# Patient Record
Sex: Female | Born: 1955 | Race: Black or African American | Hispanic: No | Marital: Single | Smoking: Never smoker
Health system: Southern US, Community
[De-identification: ages and names within clinical notes are randomized; demographics above are authoritative.]

---

## 2002-05-06 ENCOUNTER — Encounter: Payer: Self-pay | Admitting: Emergency Medicine

## 2002-05-06 ENCOUNTER — Emergency Department (HOSPITAL_COMMUNITY): Admission: EM | Admit: 2002-05-06 | Discharge: 2002-05-06 | Payer: Self-pay | Admitting: Emergency Medicine

## 2009-08-10 ENCOUNTER — Emergency Department (HOSPITAL_COMMUNITY): Admission: EM | Admit: 2009-08-10 | Discharge: 2009-08-10 | Payer: Self-pay | Admitting: Emergency Medicine

## 2012-11-02 ENCOUNTER — Emergency Department (INDEPENDENT_AMBULATORY_CARE_PROVIDER_SITE_OTHER)
Admission: EM | Admit: 2012-11-02 | Discharge: 2012-11-02 | Disposition: A | Discharge status: Home / Self Care | Payer: BC Managed Care – PPO | Source: Home / Self Care

## 2012-11-02 ENCOUNTER — Encounter (HOSPITAL_COMMUNITY): Payer: Self-pay | Admitting: Emergency Medicine

## 2012-11-02 DIAGNOSIS — J040 Acute laryngitis: Secondary | ICD-10-CM

## 2012-11-02 DIAGNOSIS — J069 Acute upper respiratory infection, unspecified: Secondary | ICD-10-CM

## 2012-11-02 MED ORDER — LIDOCAINE VISCOUS 2 % MT SOLN: 20.0000 mL | OROMUCOSAL | Status: DC | PRN

## 2012-11-02 MED ORDER — PREDNISONE 10 MG PO TABS: 50.0000 mg | ORAL_TABLET | Freq: Every day | ORAL | Status: DC

## 2012-11-02 NOTE — ED Provider Notes (Signed)
Medical screening examination/treatment/procedure(s) were performed by non-physician practitioner and as supervising physician I was immediately available for consultation/collaboration.  Murlene Revell, M.D.  Kelsee Preslar C Yutaka Holberg, MD 11/02/12 2116 

## 2012-11-02 NOTE — ED Notes (Signed)
Pt is here for sore throat x3 days Sx include: dysphagia Denies: f/v/n/d Recently got over the cold Has been taking OTC cold meds w/little relief  She is alert and oriented w/no signs of acute distress.

## 2012-11-02 NOTE — ED Provider Notes (Signed)
History     CSN: [id number]  Arrival date & time 11/02/12  1201   None     Chief Complaint  Patient presents with  . Sore Throat    (Consider location/radiation/quality/duration/timing/severity/associated sxs/prior treatment) Patient is a 57 y.o. female presenting with pharyngitis. The history is provided by the patient.  Sore Throat This is a new problem. Episode onset: 5 days ago. The problem occurs constantly. The problem has been gradually worsening. Associated symptoms comments: Congestion, post nasal drip. The symptoms are aggravated by swallowing. Nothing relieves the symptoms. Treatments tried: otc cold medicines. Improvement on treatment: transient relief.    History reviewed. No pertinent past medical history.  History reviewed. No pertinent past surgical history.  History reviewed. No pertinent family history.  History  Substance Use Topics  . Smoking status: Never Smoker   . Smokeless tobacco: Not on file  . Alcohol Use: No    OB History   Grav Para Term Preterm Abortions TAB SAB Ect Mult Living                  Review of Systems  Constitutional: Positive for chills. Negative for fever.  HENT: Positive for congestion, sore throat, rhinorrhea, voice change and postnasal drip. Negative for ear pain, trouble swallowing and sinus pressure.   Respiratory: Positive for cough.     Allergies  Review of patient's allergies indicates no known allergies.  Home Medications   Current Outpatient Rx  Name  Route  Sig  Dispense  Refill  . lidocaine (XYLOCAINE) 2 % solution   Oral   Take 20 mLs by mouth every 3 (three) hours as needed for pain.   100 mL   0   . predniSONE (DELTASONE) 10 MG tablet   Oral   Take 5 tablets (50 mg total) by mouth daily.   15 tablet   0     BP 135/72  Pulse 84  Temp(Src) 98.6 F (37 C) (Oral)  Resp 16  SpO2 96%  Physical Exam  Constitutional: She appears well-developed and well-nourished. She appears ill. No distress.   HENT:  Right Ear: Tympanic membrane, external ear and ear canal normal.  Left Ear: Tympanic membrane, external ear and ear canal normal.  Nose: Mucosal edema and rhinorrhea present. Right sinus exhibits no maxillary sinus tenderness and no frontal sinus tenderness. Left sinus exhibits no maxillary sinus tenderness and no frontal sinus tenderness.  Mouth/Throat: Mucous membranes are normal. Posterior oropharyngeal edema and posterior oropharyngeal erythema present. No oropharyngeal exudate.  Hoarse voice  Cardiovascular: Normal rate and regular rhythm.   Pulmonary/Chest: Effort normal and breath sounds normal.  Lymphadenopathy:       Head (right side): No submental, no submandibular and no tonsillar adenopathy present.       Head (left side): No submental, no submandibular and no tonsillar adenopathy present.    ED Course  Procedures (including critical care time)  Labs Reviewed  POCT RAPID STREP A ([facility] CARE ONLY)   No results found.   1. URI (upper respiratory infection)   2. Laryngitis       MDM          Cathlyn Parsons, NP 11/02/12 1419

## 2013-09-03 ENCOUNTER — Ambulatory Visit (INDEPENDENT_AMBULATORY_CARE_PROVIDER_SITE_OTHER): Payer: BC Managed Care – PPO | Admitting: Family Medicine

## 2013-09-03 VITALS — BP 132/78 | HR 95 | Temp 98.6°F | Resp 18 | Ht 62.0 in | Wt 157.0 lb

## 2013-09-03 DIAGNOSIS — N390 Urinary tract infection, site not specified: Secondary | ICD-10-CM

## 2013-09-03 DIAGNOSIS — R309 Painful micturition, unspecified: Secondary | ICD-10-CM

## 2013-09-03 DIAGNOSIS — K3189 Other diseases of stomach and duodenum: Secondary | ICD-10-CM

## 2013-09-03 DIAGNOSIS — R109 Unspecified abdominal pain: Secondary | ICD-10-CM

## 2013-09-03 DIAGNOSIS — R1013 Epigastric pain: Secondary | ICD-10-CM

## 2013-09-03 DIAGNOSIS — R300 Dysuria: Secondary | ICD-10-CM

## 2013-09-03 LAB — POCT URINALYSIS DIPSTICK
Bilirubin, UA: NEGATIVE
Glucose, UA: 100
Ketones, UA: NEGATIVE
Nitrite, UA: POSITIVE
Protein, UA: 300
Spec Grav, UA: 1.01
Urobilinogen, UA: 2
pH, UA: 5

## 2013-09-03 LAB — POCT UA - MICROSCOPIC ONLY
Casts, Ur, LPF, POC: NEGATIVE
Crystals, Ur, HPF, POC: NEGATIVE
Mucus, UA: POSITIVE
Yeast, UA: NEGATIVE

## 2013-09-03 MED ORDER — SULFAMETHOXAZOLE-TMP DS 800-160 MG PO TABS: 1.0000 | ORAL_TABLET | Freq: Two times a day (BID) | ORAL | Status: DC

## 2013-09-03 NOTE — Patient Instructions (Addendum)
Take the sulfa antibiotic one twice daily with food  Continue taking your bladder pills that you brought 4 water 2 days until the antibiotic has had time to work  Lots of fluids  Return if worse   Urinary Tract Infection Urinary tract infections (UTIs) can develop anywhere along your urinary tract. Your urinary tract is your body's drainage system for removing wastes and extra water. Your urinary tract includes two kidneys, two ureters, a bladder, and a urethra. Your kidneys are a pair of bean-shaped organs. Each kidney is about the size of your fist. They are located below your ribs, one on each side of your spine. CAUSES Infections are caused by microbes, which are microscopic organisms, including fungi, viruses, and bacteria. These organisms are so small that they can only be seen through a microscope. Bacteria are the microbes that most commonly cause UTIs. SYMPTOMS  Symptoms of UTIs may vary by age and gender of the patient and by the location of the infection. Symptoms in young women typically include a frequent and intense urge to urinate and a painful, burning feeling in the bladder or urethra during urination. Older women and men are more likely to be tired, shaky, and weak and have muscle aches and abdominal pain. A fever may mean the infection is in your kidneys. Other symptoms of a kidney infection include pain in your back or sides below the ribs, nausea, and vomiting. DIAGNOSIS To diagnose a UTI, your caregiver will ask you about your symptoms. Your caregiver also will ask to provide a urine sample. The urine sample will be tested for bacteria and white blood cells. White blood cells are made by your body to help fight infection. TREATMENT  Typically, UTIs can be treated with medication. Because most UTIs are caused by a bacterial infection, they usually can be treated with the use of antibiotics. The choice of antibiotic and length of treatment depend on your symptoms and the type of  bacteria causing your infection. HOME CARE INSTRUCTIONS  If you were prescribed antibiotics, take them exactly as your caregiver instructs you. Finish the medication even if you feel better after you have only taken some of the medication.  Drink enough water and fluids to keep your urine clear or pale yellow.  Avoid caffeine, tea, and carbonated beverages. They tend to irritate your bladder.  Empty your bladder often. Avoid holding urine for long periods of time.  Empty your bladder before and after sexual intercourse.  After a bowel movement, women should cleanse from front to back. Use each tissue only once. SEEK MEDICAL CARE IF:   You have back pain.  You develop a fever.  Your symptoms do not begin to resolve within 3 days. SEEK IMMEDIATE MEDICAL CARE IF:   You have severe back pain or lower abdominal pain.  You develop chills.  You have nausea or vomiting.  You have continued burning or discomfort with urination. MAKE SURE YOU:   Understand these instructions.  Will watch your condition.  Will get help right away if you are not doing well or get worse. Document Released: 05/15/2005 Document Revised: 02/04/2012 Document Reviewed: 09/13/2011 [facility] Patient Information 2014 [place], [place].

## 2013-09-03 NOTE — Progress Notes (Signed)
Subjective: 58 year old lady who started getting low abdominal pain and urinary frequency and incontinence a couple of days ago. She bought some over-the-counter Pyridium-type medication which has given a little bit of relief but not completely. She's been wearing pads because of the leakage. She does not have a history of prior UTIs. She has not been feverish. No CVA pain. But she has her across her lower abdomen  Objective: No acute distress. No CVA tenderness. Abdomen soft. Tender in the suprapubic midline area.   Results for orders placed in visit on 09/03/13  POCT URINALYSIS DIPSTICK      Result Value Range   Color, UA orange     Clarity, UA cloudy     Glucose, UA 100     Bilirubin, UA neg     Ketones, UA neg     Spec Grav, UA 1.010     Blood, UA moderate     pH, UA 5.0     Protein, UA 300     Urobilinogen, UA 2.0     Nitrite, UA positive     Leukocytes, UA large (3+)    POCT UA - MICROSCOPIC ONLY      Result Value Range   WBC, Ur, HPF, POC TNTC     RBC, urine, microscopic TNTC     Bacteria, U Microscopic 1+     Mucus, UA positive     Epithelial cells, urine per micros 0-2     Crystals, Ur, HPF, POC neg     Casts, Ur, LPF, POC neg     Yeast, UA neg     Assessment: UTI Urinary pain and incontinence Low abdominal pain  Plan: Bactrim Fluid Continue her OTC medications Culture Return if needed

## 2013-09-06 LAB — URINE CULTURE

## 2017-09-14 ENCOUNTER — Encounter (HOSPITAL_COMMUNITY): Payer: Self-pay | Admitting: *Deleted

## 2017-09-14 ENCOUNTER — Other Ambulatory Visit: Payer: Self-pay

## 2017-09-14 ENCOUNTER — Ambulatory Visit (HOSPITAL_COMMUNITY)
Admission: EM | Admit: 2017-09-14 | Discharge: 2017-09-14 | Disposition: A | Discharge status: Home / Self Care | Payer: BLUE CROSS/BLUE SHIELD | Attending: Family Medicine | Admitting: Family Medicine

## 2017-09-14 ENCOUNTER — Ambulatory Visit (INDEPENDENT_AMBULATORY_CARE_PROVIDER_SITE_OTHER): Payer: BLUE CROSS/BLUE SHIELD

## 2017-09-14 DIAGNOSIS — R52 Pain, unspecified: Secondary | ICD-10-CM | POA: Diagnosis not present

## 2017-09-14 DIAGNOSIS — M79605 Pain in left leg: Secondary | ICD-10-CM

## 2017-09-14 MED ORDER — PREDNISONE 20 MG PO TABS: ORAL_TABLET | ORAL | 0 refills | Status: DC

## 2017-09-14 NOTE — Discharge Instructions (Signed)
Your pain is most consistent with nerve root irritation.  Please follow up with your doctor if you are not improving over the next 1-2 days.  CLINICAL DATA:  Left upper leg and hip pain for 3 weeks. Tenderness at the trochanter.   EXAM: DG HIP (WITH OR WITHOUT PELVIS) 2-3V LEFT   COMPARISON:  None.   FINDINGS: There is no evidence of hip fracture or dislocation. There is no evidence of arthropathy or other focal bone abnormality.   IMPRESSION: Negative.     Electronically Signed   By: Marnee SpringJonathon  Watts M.D.   On: 09/14/2017 13:30

## 2017-09-14 NOTE — ED Provider Notes (Signed)
[facility]   [id number] 09/14/17 Arrival Time: 1213   SUBJECTIVE:  Taylor Day is a 62 y.o. female who presents to the urgent care with complaint of constant LLE pain to entire leg x approx 3 wks, that started off intermittently.  Denies injury or back pain.  "Unsure" if parasthesias.  Has been trying OTC pain med without relief.  Works at [employer] and on [employer] all day long.  Feels stiff and uncomfortable after sitting awhile.   History reviewed. No pertinent past medical history. History reviewed. No pertinent family history. Social History   Socioeconomic History  . Marital status: Single    Spouse name: Not on file  . Number of children: Not on file  . Years of education: Not on file  . Highest education level: Not on file  Social Needs  . Financial resource strain: Not on file  . Food insecurity - worry: Not on file  . Food insecurity - inability: Not on file  . Transportation needs - medical: Not on file  . Transportation needs - non-medical: Not on file  Occupational History  . Not on file  Tobacco Use  . Smoking status: Never Smoker  Substance and Sexual Activity  . Alcohol use: No  . Drug use: No  . Sexual activity: Not on file  Other Topics Concern  . Not on file  Social History Narrative  . Not on file   No outpatient medications have been marked as taking for the 09/14/17 encounter [facility](Hospital Encounter).   No Known Allergies    ROS: As per HPI, remainder of ROS negative.   OBJECTIVE:   Vitals:   09/14/17 1236  BP: (!) 167/83  Pulse: 70  Resp: 18  Temp: 97.9 F (36.6 C)  TempSrc: Oral  SpO2: 100%     General appearance: alert; no distress Eyes: PERRL; EOMI; conjunctiva normal HENT: normocephalic; atraumatic;  Neck: supple Lungs: clear to auscultation bilaterally Heart: regular rate and rhythm Abdomen: soft, non-tender; bowel sounds normal; no masses or organomegaly; no guarding or rebound tenderness Back: no CVA  tenderness Extremities: no cyanosis or edema; symmetrical with no gross deformities; full passive range of motion of left hip, left knee.  Straight leg raising is minimally uncomfortable at 45 degrees on the left only.  No loss of muscle mass on the left. Skin: warm and dry Neurologic: normal gait; grossly normal Psychological: alert and cooperative; normal mood and affect      Labs:  Results for orders placed or performed in visit on 09/03/13  Urine culture  Result Value Ref Range   Culture ESCHERICHIA COLI    Colony Count 65,000 COLONIES/ML    Organism ID, Bacteria ESCHERICHIA COLI       Susceptibility   Escherichia coli -  (no method available)    AMPICILLIN <=2 Sensitive     AMPICILLIN/SULBACTAM <=2 Sensitive     PIP/TAZO <=4 Sensitive     IMIPENEM <=0.25 Sensitive     CEFAZOLIN <=4 Sensitive     CEFOXITIN <=4 Sensitive     CEFTRIAXONE <=1 Sensitive     CEFTAZIDIME <=1 Sensitive     CEFEPIME <=1 Sensitive     GENTAMICIN <=1 Sensitive     TOBRAMYCIN <=1 Sensitive     CIPROFLOXACIN <=0.25 Sensitive     LEVOFLOXACIN <=0.12 Sensitive     NITROFURANTOIN <=16 Sensitive     TRIMETH/SULFA <=20 Sensitive   POCT urinalysis dipstick  Result Value Ref Range   Color, UA orange  Clarity, UA cloudy    Glucose, UA 100    Bilirubin, UA neg    Ketones, UA neg    Spec Grav, UA 1.010    Blood, UA moderate    pH, UA 5.0    Protein, UA 300    Urobilinogen, UA 2.0    Nitrite, UA positive    Leukocytes, UA large (3+)   POCT UA - Microscopic Only  Result Value Ref Range   WBC, Ur, HPF, POC TNTC    RBC, urine, microscopic TNTC    Bacteria, U Microscopic 1+    Mucus, UA positive    Epithelial cells, urine per micros 0-2    Crystals, Ur, HPF, POC neg    Casts, Ur, LPF, POC neg    Yeast, UA neg     Labs Reviewed - No data to display  Dg Hip Unilat With Pelvis 2-3 Views Left  Result Date: 09/14/2017 CLINICAL DATA:  Left upper leg and hip pain for 3 weeks. Tenderness at the  trochanter. EXAM: DG HIP (WITH OR WITHOUT PELVIS) 2-3V LEFT COMPARISON:  None. FINDINGS: There is no evidence of hip fracture or dislocation. There is no evidence of arthropathy or other focal bone abnormality. IMPRESSION: Negative. Electronically Signed   By: Marnee Spring M.D.   On: 09/14/2017 13:30       ASSESSMENT & PLAN:  1. Left leg pain   2. Pain     Meds ordered this encounter  Medications  . predniSONE (DELTASONE) 20 MG tablet    Sig: Two daily with food    Dispense:  10 tablet    Refill:  0    Reviewed expectations re: course of current medical issues. Questions answered. Outlined signs and symptoms indicating need for more acute intervention. Patient verbalized understanding. After Visit Summary given.    Procedures:Your pain is most consistent with nerve root irritation.  Please follow up with your doctor if you are not improving over the next 1-2 days.      Elvina Sidle, MD 09/14/17 1344

## 2017-09-14 NOTE — ED Triage Notes (Signed)
C/O constant LLE pain to entire leg x approx 3 wks, that started off intermittently.  Denies injury or back pain.  "Unsure" if parasthesias.  Has been trying OTC pain med without relief.

## 2018-06-11 ENCOUNTER — Emergency Department (HOSPITAL_COMMUNITY)
Admission: EM | Admit: 2018-06-11 | Discharge: 2018-06-11 | Disposition: A | Discharge status: Home / Self Care | Payer: BLUE CROSS/BLUE SHIELD | Attending: Emergency Medicine | Admitting: Emergency Medicine

## 2018-06-11 ENCOUNTER — Other Ambulatory Visit: Payer: Self-pay

## 2018-06-11 ENCOUNTER — Encounter (HOSPITAL_COMMUNITY): Payer: Self-pay

## 2018-06-11 ENCOUNTER — Emergency Department (HOSPITAL_COMMUNITY): Payer: BLUE CROSS/BLUE SHIELD

## 2018-06-11 DIAGNOSIS — W19XXXA Unspecified fall, initial encounter: Secondary | ICD-10-CM

## 2018-06-11 DIAGNOSIS — Z79899 Other long term (current) drug therapy: Secondary | ICD-10-CM | POA: Insufficient documentation

## 2018-06-11 DIAGNOSIS — S8991XA Unspecified injury of right lower leg, initial encounter: Secondary | ICD-10-CM | POA: Diagnosis present

## 2018-06-11 DIAGNOSIS — Y9301 Activity, walking, marching and hiking: Secondary | ICD-10-CM | POA: Diagnosis not present

## 2018-06-11 DIAGNOSIS — Y999 Unspecified external cause status: Secondary | ICD-10-CM | POA: Diagnosis not present

## 2018-06-11 DIAGNOSIS — Y929 Unspecified place or not applicable: Secondary | ICD-10-CM | POA: Diagnosis not present

## 2018-06-11 DIAGNOSIS — Z7982 Long term (current) use of aspirin: Secondary | ICD-10-CM | POA: Insufficient documentation

## 2018-06-11 DIAGNOSIS — S8011XA Contusion of right lower leg, initial encounter: Secondary | ICD-10-CM | POA: Diagnosis not present

## 2018-06-11 DIAGNOSIS — W171XXA Fall into storm drain or manhole, initial encounter: Secondary | ICD-10-CM | POA: Insufficient documentation

## 2018-06-11 NOTE — ED Triage Notes (Signed)
Patient states she was walking and fell into a manhole 5 days ago. Patient has bruising to the right inner cal and abrasion to the left knee.

## 2018-06-11 NOTE — Discharge Instructions (Addendum)
You were evaluated today for leg pain after a fall 5 days ago.  You xrays were negative. You may take Tylenol and Ibuprofen for pain. I would suggest elevating your leg if you are not working. You may also use ice. Make sure to place a towel between your leg and the ice pack to prevent burns.  Follow- up with your PCP for re-evaluation if you have continued symptoms.  Return to the ED with any new or worsening symptoms.

## 2018-06-11 NOTE — ED Provider Notes (Signed)
[facility]-EMER[facility]ENCY [facility]PT Provider Note   CSN: [id number] Arrival date & time: 06/11/18  0813  History   Chief Complaint Chief Complaint  Patient presents with  . Fall  . Leg Injury    HPI Taylor Day is a 62 y.o. female with no significant past medical history who presents for evaluation of right leg pain after mechanical fall.  Patient states approximately 5 days ago she was walking when her right leg stepped into a manhole.  States she noticed immediate pain to her right lower extremity from knee distally.  She states that she took ibuprofen, ice and has elevated her leg.  States that these measures have moderately relieved her pain.  States that she noticed bruising to the medial portion of her calf approximately 3 days after the incident.  Has had continued to have intermittent pain.  States her pain occurs after she stands on her feet all day at work.  Pain is relieved with ibuprofen, ice and rest.  She has been able to ambulate without difficulty.  Rates her pain a 3/10.  Does not radiate.  Denies head trauma, loss of consciousness with fall, low back pain, hip pain, numbness or tingling or decreased range of motion in bilateral lower extremities.  History obtained from patient. No interpretor was used.  HPI  History reviewed. No pertinent past medical history.  There are no active problems to display for this patient.   History reviewed. No pertinent surgical history.   OB History   None      Home Medications    Prior to Admission medications   Medication Sig Start Date End Date Taking? Authorizing Provider  aspirin EC 81 MG tablet Take 81 mg by mouth daily.   Yes [provider]  cholecalciferol (VITAMIN D) 1000 units tablet Take 2,000 Units by mouth daily.   Yes [provider]  ibuprofen (ADVIL,MOTRIN) 200 MG tablet Take 200 mg by mouth every 6 (six) hours as needed for mild pain.   Yes [provider]  Multiple  Vitamins-Minerals (CENTRUM SILVER 50+WOMEN) TABS Take 1 tablet by mouth daily.   Yes [provider]  Multiple Vitamins-Minerals (OCUVITE EYE HEALTH FORMULA) CAPS Take 1 capsule by mouth daily.   Yes [provider]    Family History Family History  Problem Relation Age of Onset  . Chronic Renal Failure Father     Social History Social History   Tobacco Use  . Smoking status: Never Smoker  . Smokeless tobacco: Never Used  Substance Use Topics  . Alcohol use: No  . Drug use: No     Allergies   Patient has no known allergies.   Review of Systems Review of Systems  Constitutional: Negative.   Cardiovascular: Negative for leg swelling.  Gastrointestinal: Negative for abdominal pain, constipation, diarrhea, nausea and vomiting.  Genitourinary: Negative.   Musculoskeletal: Negative.        Right lower extremity pain with bruising to medial portion of calf.  Skin: Positive for wound.  Neurological: Negative.   All other systems reviewed and are negative.    Physical Exam Updated Vital Signs BP 130/64 (BP Location: Left Arm)   Pulse 79   Temp 97.9 F (36.6 C) (Oral)   Resp 16   Ht 5\' 2"  (1.575 m)   Wt 68 kg   SpO2 100%   BMI 27.44 kg/m   Physical Exam  Constitutional: She appears well-developed and well-nourished. No distress.  HENT:  Head: Atraumatic.  Eyes:  Pupils are equal, round, and reactive to light.  Neck: Normal range of motion.  Cardiovascular: Normal rate and intact distal pulses.  Pulses:      Dorsalis pedis pulses are 2+ on the right side, and 2+ on the left side.       Posterior tibial pulses are 2+ on the right side, and 2+ on the left side.  Pulmonary/Chest: No respiratory distress.  Abdominal: She exhibits no distension.  Musculoskeletal: Normal range of motion.       Right foot: There is normal range of motion and no deformity.       Left foot: There is normal range of motion and no deformity.  No tenderness to palpation  over right knee.  No joint line tenderness. Full range of motion with flexion and extension to bilateral lower extremity.  Full plantar flexion dorsiflexion of ankles.  Negative varus and valgus stress to right knee.  No bony tenderness to tibia or fibula.  No midline tenderness to lower back, bilateral hips or pelvis.  No shortening or rotation of bilateral hips/ lower extremity.  Patient is able to ambulate without difficulty. 5/5 strength to bilateral lower extremity.  Feet:  Right Foot:  Skin Integrity: Negative for ulcer, blister, skin breakdown, erythema, warmth or callus.  Left Foot:  Skin Integrity: Negative for ulcer, blister, skin breakdown, erythema, warmth or callus.  Neurological: She is alert. She has normal strength. No sensory deficit.  Intact sensation to sharp and dull bilateral lower extremity  Skin: Skin is warm and dry. She is not diaphoretic.  No appreciable edema or erythema to bilateral lower extremity.  Mild ecchymosis over medial portion of calf.  Negative Homans and Thompson's test.  No warmth to bilateral lower extremity.  Well-healing approximately quarter size abrasions to bilateral knees.  Psychiatric: She has a normal mood and affect.  Nursing note and vitals reviewed.    ED Treatments / Results  Labs (all labs ordered are listed, but only abnormal results are displayed) Labs Reviewed - No data to display  EKG None  Radiology Dg Tibia/fibula Right  Result Date: 06/11/2018 CLINICAL DATA:  Right lower leg pain since an injury when the patient fell into a manhole 5 days ago. Initial encounter. EXAM: RIGHT TIBIA AND FIBULA - 2 VIEW COMPARISON:  None. FINDINGS: There is no evidence of fracture or other focal bone lesions. Mild spurring about the patella and calcaneus noted. Soft tissues are unremarkable. IMPRESSION: Negative exam. Electronically Signed   By: Drusilla Kanner M.D.   On: 06/11/2018 09:29   Dg Ankle Complete Right  Result Date:  06/11/2018 CLINICAL DATA:  Right ankle pain since the patient fell into a manhole 5 days ago. Initial encounter. EXAM: RIGHT ANKLE - COMPLETE 3+ VIEW COMPARISON:  None. FINDINGS: There is no evidence of fracture, dislocation, or joint effusion. There is no evidence of arthropathy or other focal bone abnormality. Calcaneal spurring noted. Soft tissues are unremarkable. IMPRESSION: Negative exam. Electronically Signed   By: Drusilla Kanner M.D.   On: 06/11/2018 09:30   Dg Knee Complete 4 Views Right  Result Date: 06/11/2018 CLINICAL DATA:  Right knee pain since the patient suffered an injury when she fell into a manhole 5 days ago. Initial encounter. EXAM: RIGHT KNEE - COMPLETE 4+ VIEW COMPARISON:  None. FINDINGS: No evidence of fracture, dislocation, or joint effusion. No evidence of arthropathy or other focal bone abnormality. Mild spurring off the superior pole of the patella is noted. Soft tissues are unremarkable. IMPRESSION:  Negative exam. Electronically Signed   By: Drusilla Kanner M.D.   On: 06/11/2018 09:29    Procedures Procedures (including critical care time)  Medications Ordered in ED Medications - No data to display   Initial Impression / Assessment and Plan / ED Course  I have reviewed the triage vital signs and the nursing notes.  Pertinent labs & imaging results that were available during my care of the patient were reviewed by me and considered in my medical decision making (see chart for details).  62 year old otherwise well-appearing female presents for evaluation after mechanical fall x5 days ago.  Afebrile, nonseptic, non-ill-appearing. No tenderness to palpation of BLE, mild ecchymosis to medial portion of right calf.  No edema, erythema or warmth to bilateral lower extremities.  Has been able to ambulate without difficulty.  Neurovascularly intact.  Normal musculoskeletal exam.  Low suspicion for fracture or dislocation, however patient is requesting x-rays at this time.   Will obtain x-ray knee, tibia-fibula and ankle.  Does not want anything for pain at this time.  Plain films negative. On re-evalaution patient is ambulating around room without difficulty. Low suspicion for ligament or tendon rupture injury. BLE compartments are soft. Patient stable for dc home at this time. Discussed reasons to return to the ED. Recommended follow-up with PCP for reevaluation if symptoms continue. Patient voiced understanding and is agreeable for follow-up.   Final Clinical Impressions(s) / ED Diagnoses   Final diagnoses:  Fall, initial encounter  Contusion of right lower extremity, initial encounter    ED Discharge Orders    None       Emanuela Runnion A, PA-C 06/11/18 1004    Bethann Berkshire, MD 06/13/18 2121

## 2019-08-10 IMAGING — CR DG ANKLE COMPLETE 3+V*R*
3 series · 3 of 3 positions shown · non-contrast
Comparison: None.

CLINICAL DATA: Right ankle pain since the patient fell into a
manhole 5 days ago. Initial encounter.

EXAM:
RIGHT ANKLE - COMPLETE 3+ VIEW

[x ankle ap right]
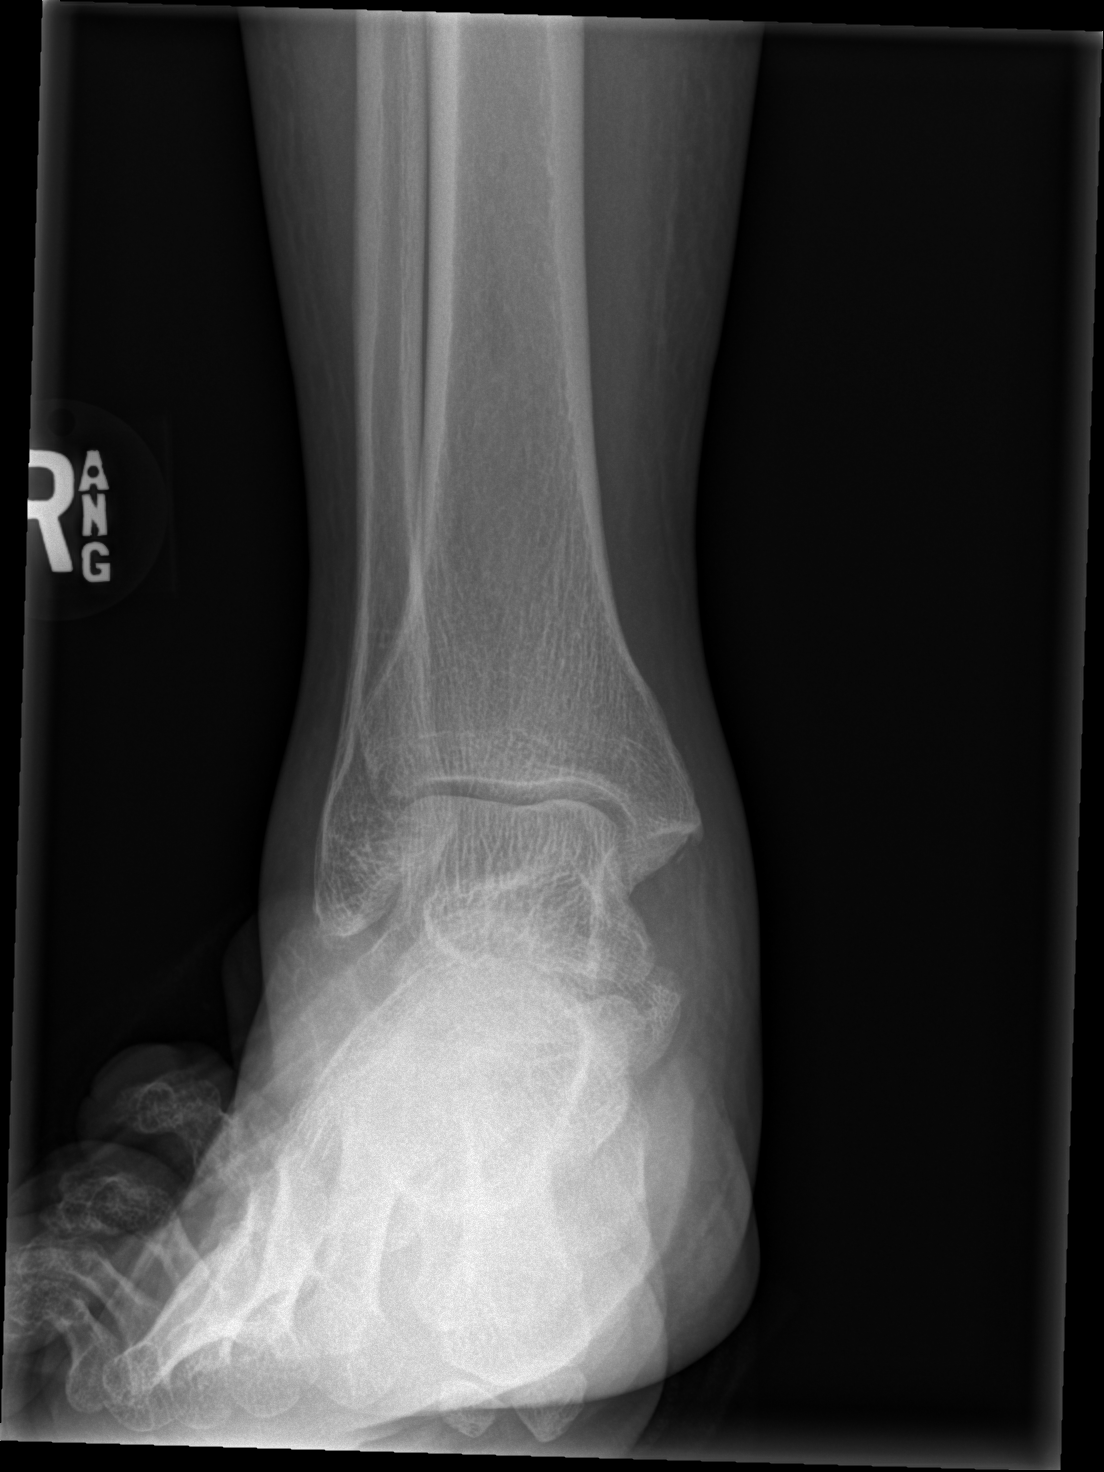

[x ankle obl right]
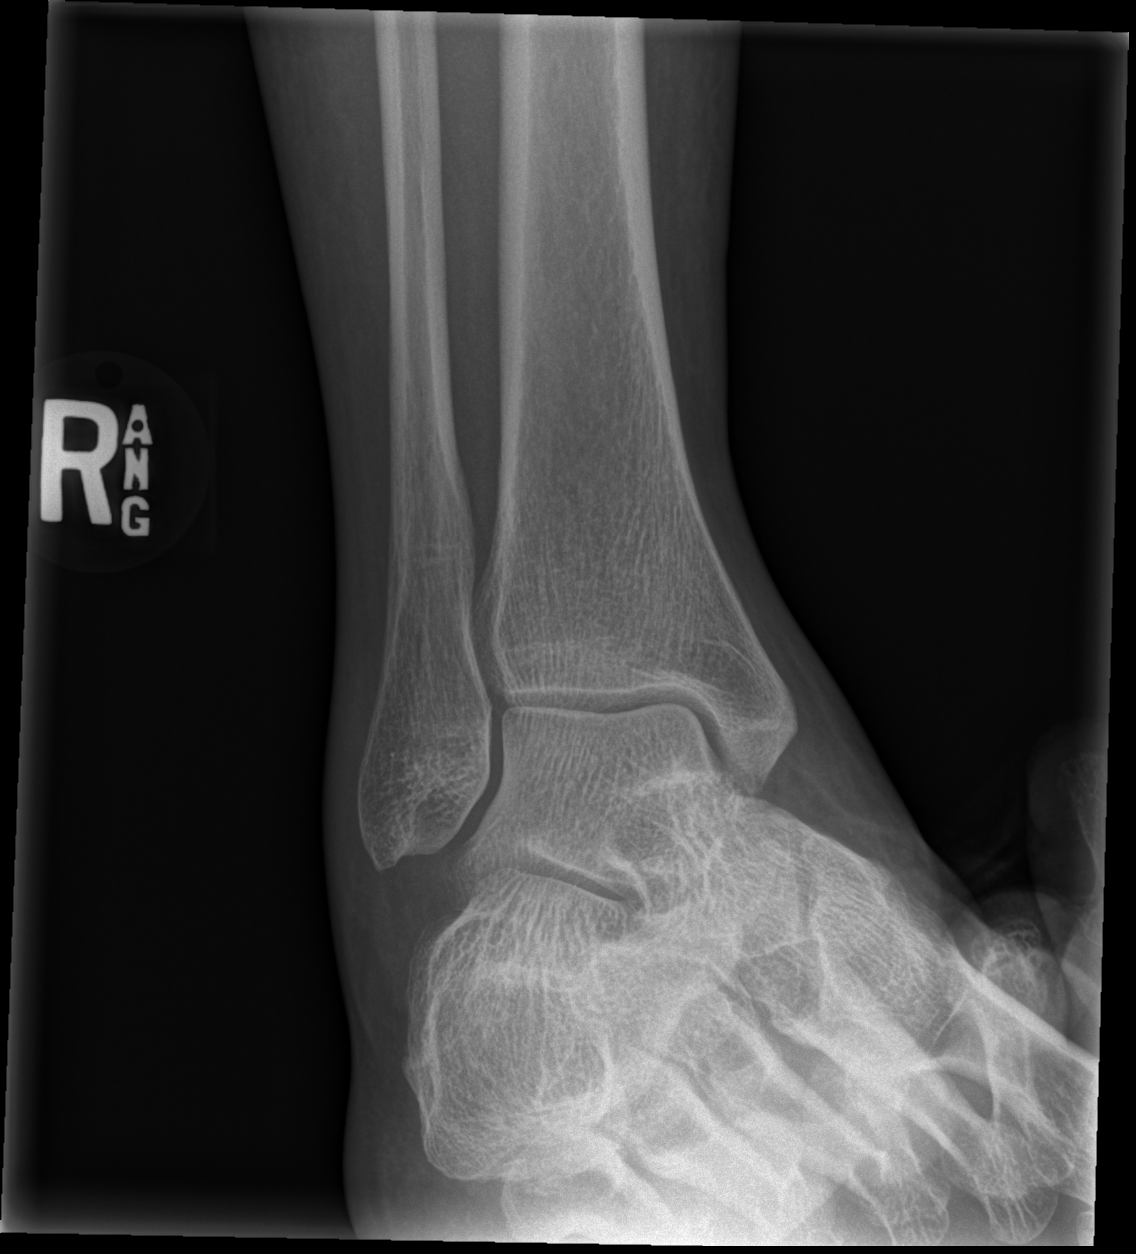

[x ankle lat right]
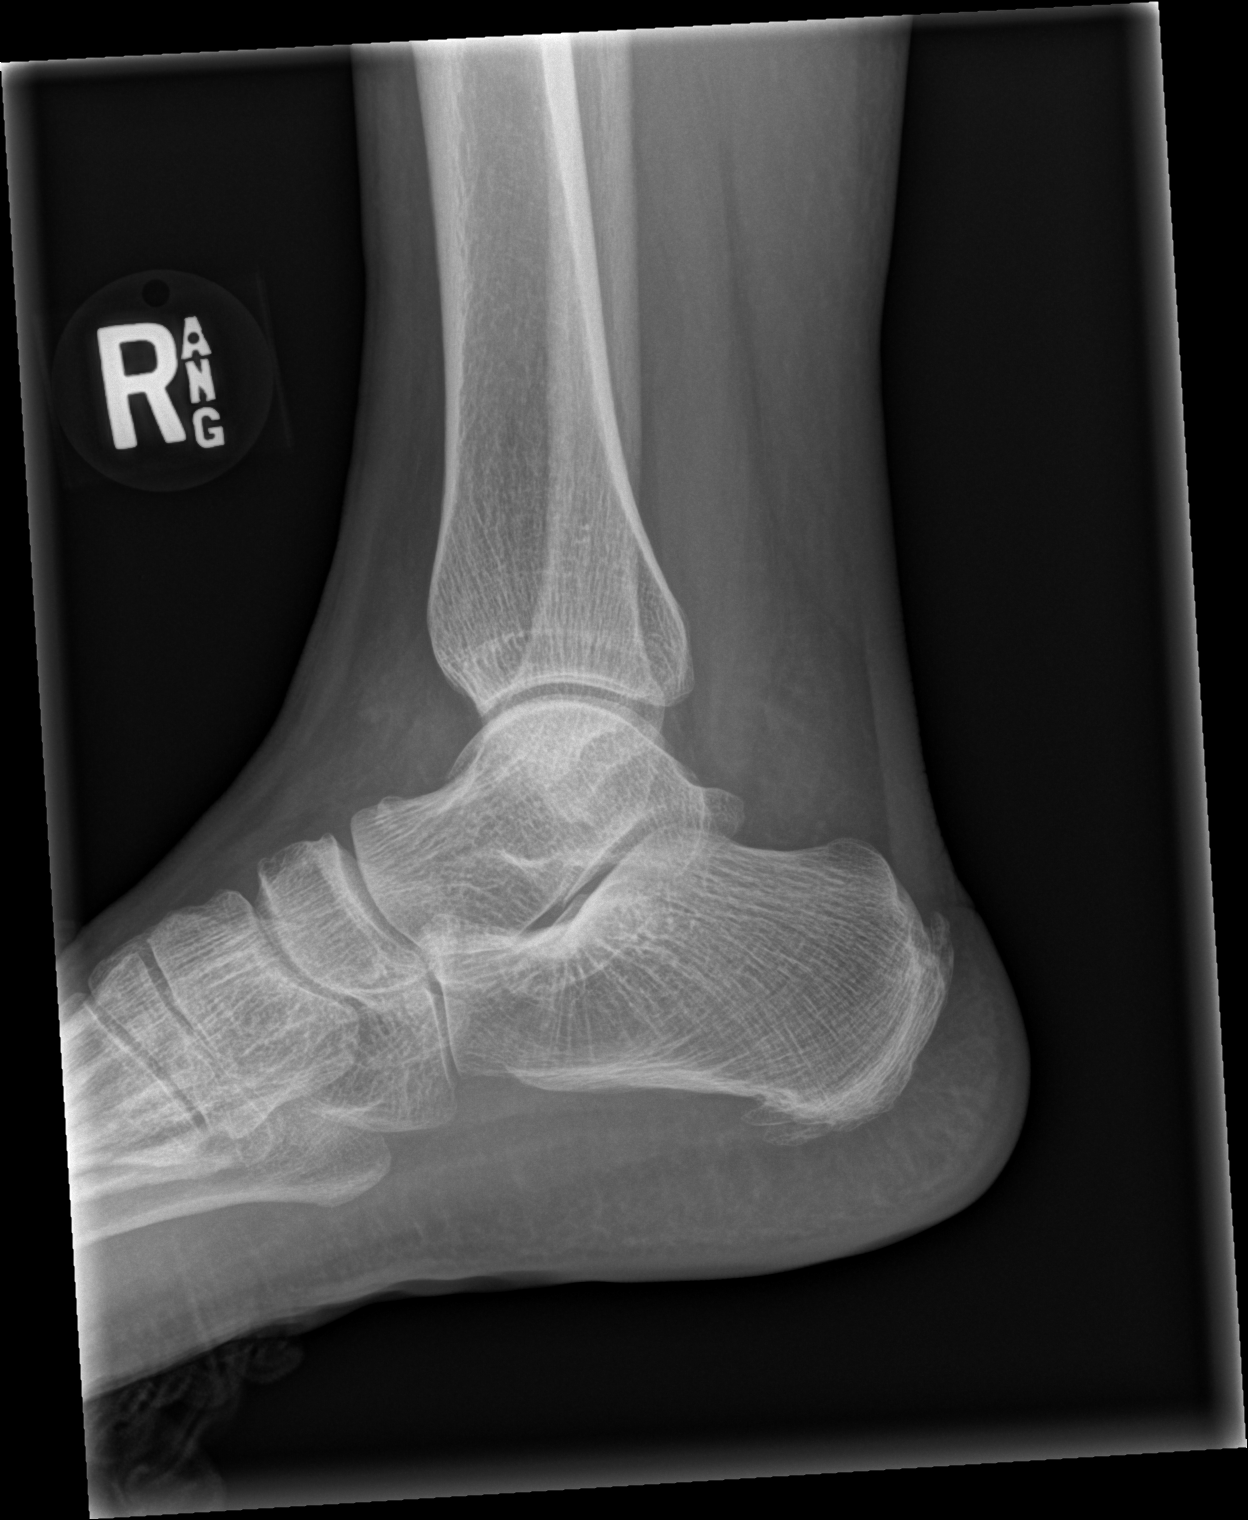

[3 of 3 positions shown; findings below may reference images not displayed]

FINDINGS: There is no evidence of fracture, dislocation, or joint effusion.
There is no evidence of arthropathy or other focal bone abnormality.
Calcaneal spurring noted. Soft tissues are unremarkable.
IMPRESSION: Negative exam.

## 2022-06-10 ENCOUNTER — Other Ambulatory Visit: Payer: Self-pay | Admitting: Family Medicine

## 2022-06-10 DIAGNOSIS — E2839 Other primary ovarian failure: Secondary | ICD-10-CM

## 2022-06-10 DIAGNOSIS — Z1231 Encounter for screening mammogram for malignant neoplasm of breast: Secondary | ICD-10-CM

## 2023-11-21 ENCOUNTER — Other Ambulatory Visit (HOSPITAL_COMMUNITY): Payer: Self-pay

## 2024-09-05 ENCOUNTER — Encounter (HOSPITAL_COMMUNITY): Payer: Self-pay

## 2024-09-05 ENCOUNTER — Other Ambulatory Visit: Payer: Self-pay

## 2024-09-05 ENCOUNTER — Emergency Department (HOSPITAL_COMMUNITY)

## 2024-09-05 ENCOUNTER — Emergency Department (HOSPITAL_COMMUNITY)
Admission: EM | Admit: 2024-09-05 | Discharge: 2024-09-05 | Disposition: A | Discharge status: Home / Self Care | Attending: Emergency Medicine | Admitting: Emergency Medicine

## 2024-09-05 DIAGNOSIS — S0592XA Unspecified injury of left eye and orbit, initial encounter: Secondary | ICD-10-CM | POA: Diagnosis present

## 2024-09-05 DIAGNOSIS — W01198A Fall on same level from slipping, tripping and stumbling with subsequent striking against other object, initial encounter: Secondary | ICD-10-CM | POA: Insufficient documentation

## 2024-09-05 DIAGNOSIS — S0990XA Unspecified injury of head, initial encounter: Secondary | ICD-10-CM | POA: Insufficient documentation

## 2024-09-05 DIAGNOSIS — S01112A Laceration without foreign body of left eyelid and periocular area, initial encounter: Secondary | ICD-10-CM | POA: Diagnosis not present

## 2024-09-05 MED ORDER — BACITRACIN ZINC 500 UNIT/GM EX OINT: 1.0000 | TOPICAL_OINTMENT | Freq: Two times a day (BID) | CUTANEOUS | 0 refills | Status: AC

## 2024-09-05 MED ORDER — LIDOCAINE-EPINEPHRINE (PF) 2 %-1:200000 IJ SOLN
INTRAMUSCULAR | Status: AC
  Filled 2024-09-05: qty 20

## 2024-09-05 NOTE — Discharge Instructions (Addendum)
 You were evaluated in the Emergency Department and after careful evaluation, we did not find any emergent condition requiring admission or further testing in the hospital.  Your exam/testing today is overall reassuring.  Dr. Austin the ophthalmologist repaired your laceration here in the emergency department.  You will need to follow-up for reassessment and suture removal in about a week.  Call the office number to schedule a formal appointment.  Recommend use of the bacitracin  twice daily to the area to help prevent infection.  Please return to the Emergency Department if you experience any worsening of your condition.   Thank you for allowing us  to be a part of your care.

## 2024-09-05 NOTE — ED Notes (Signed)
 Ophthalmology at bedside

## 2024-09-05 NOTE — Consult Note (Signed)
 Ophthalmology Consult Note  HPI: Patient is a 69 y.o. female currently admitted to ER for lid laceration s/p fall. Patient was intoxicated and fell and hit her eye on a table. Patient denies vision change, notes pain to eyelid, no pain with eye movement, states she was told 2 years ago that she had cataracts and needed surgery, wears reading glasses which she does not have with her. States her vision is generally blurry but states it doesn't bother her enough to go see an eye doctor.   Eye exam   VA on near card 20/200 OD, 20/100 OS CVF full ou EOM full ou  IOP 9 OD, 12 OS  L/L large contusion to brow OS, upper and lower lid swelling OS, OS with about 12 mm full thickness lid laceration extending from about 1 cm medial to lateral canthus diagonally down towards cheek, OD wnl C/S mild injection OS, wnl OD AC wnl ou  Pupils round and reactive Lens cataract ou   Assessment and Plan: Full thickness left lower lid laceration  - repaired in the ER, bacitracin  ointment placed over sutures - apply bacitracin  ointment to affected area TID, avoid eye rubbing - patient will need removal of silk sutures in 7-14 days. To call office to schedule follow up.    [facility]  [facility]   Lid laceration repair procedure:  A drop of proparacaine was instilled in the eye. The lid was prepped with betadine. The edges of the laceration were numbed with 2% lidocaine  with epi. The tarsus was identified and sutures with 6-0 vicryl. The lash and grey line were identified and sutured with 1 stitch of 4-0 silk. The about 12 mm of skin were approximated and sutured with 6-0 gut. Bacitracin  ointment was placed along the laceration. Patient tolerated procedure well.

## 2024-09-05 NOTE — ED Provider Notes (Signed)
 " [facility] Emergency Department Provider Note MRN:  [id number]  Arrival date & time: 09/05/24     Chief Complaint   Fall   History of Present Illness   Taylor Day is a 69 y.o. year-old female with no pertinent past medical history presenting to the ED with chief complaint of fall.  Patient was drinking alcohol this evening, lost her balance and fell.  Struck her eye against the order of the table.  Denies loss of consciousness, endorsing pain surrounding the left eye and her eyelid is cut.  Review of Systems  A thorough review of systems was obtained and all systems are negative except as noted in the HPI and PMH.   Patient's Health History   History reviewed. No pertinent past medical history.  History reviewed. No pertinent surgical history.  Family History  Problem Relation Age of Onset   Chronic Renal Failure Father     Social History   Socioeconomic History   Marital status: Single    Spouse name: Not on file   Number of children: Not on file   Years of education: Not on file   Highest education level: Not on file  Occupational History   Not on file  Tobacco Use   Smoking status: Never   Smokeless tobacco: Never  Vaping Use   Vaping status: Never Used  Substance and Sexual Activity   Alcohol use: No   Drug use: No   Sexual activity: Not on file  Other Topics Concern   Not on file  Social History Narrative   Not on file   Social Drivers of Health   Tobacco Use: Low Risk (09/05/2024)   Patient History    Smoking Tobacco Use: Never    Smokeless Tobacco Use: Never    Passive Exposure: Not on file  Financial Resource Strain: Not on file  Food Insecurity: Not on file  Transportation Needs: Not on file  Physical Activity: Not on file  Stress: Not on file  Social Connections: Not on file  Intimate Partner Violence: Not on file  Depression (EYV7-0): Not on file  Alcohol Screen: Not on file  Housing: Not on file  Utilities: Not  on file  Health Literacy: Not on file     Physical Exam   Vitals:   09/05/24 0230 09/05/24 0300  BP: 138/60 128/60  Pulse: 87 61  Resp:    Temp:    SpO2: 98% 99%    CONSTITUTIONAL: Well-appearing, NAD NEURO/PSYCH:  Alert and oriented x 3, no focal deficits EYES:  eyes equal and reactive ENT/NECK:  no LAD, no JVD CARDIO: Regular rate, well-perfused, normal S1 and S2 PULM:  CTAB no wheezing or rhonchi GI/GU:  non-distended, non-tender MSK/SPINE:  No gross deformities, no edema SKIN:  no rash, atraumatic   *Additional and/or pertinent findings included in MDM below  Diagnostic and Interventional Summary    EKG Interpretation Date/Time:    Ventricular Rate:    PR Interval:    QRS Duration:    QT Interval:    QTC Calculation:   R Axis:      Text Interpretation:         Labs Reviewed - No data to display  CT HEAD WO CONTRAST ( )  Final Result    CT CERVICAL SPINE WO CONTRAST  Final Result    CT MAXILLOFACIAL WO CONTRAST  Final Result      Medications  lidocaine -EPINEPHrine  (XYLOCAINE  W/EPI) 2 %-1:200000 (PF) injection (  Given by Other  09/05/24 0250)     Procedures  /  Critical Care Procedures  ED Course and Medical Decision Making  Initial Impression and Ddx Patient has a flap laceration of the left lower eyelid, full-thickness.  Will need specialist repair.  Does not appear that her orbit is affected.  Normal extraocular movements, I see no signs of open globe or other ophthalmologic concerns.  She has a fair amount of periorbital swelling.  Will obtain CT imaging to exclude facial fractures, cranial bleeding excetra.  Past medical/surgical history that increases complexity of ED encounter: None  Interpretation of Diagnostics I personally reviewed the CT imaging and my interpretation is as follows: No fractures, no intracranial bleeding    Patient Reassessment and Ultimate Disposition/Management     I discussed the case with Dr. Lowery of  plastic surgery who recommends ophthalmology consultation.  Dr. Austin of ophthalmology came to the emergency department to evaluate the orbit and repair the laceration.  Patient is now appropriate for discharge and follow-up in the ophthalmology office.  Patient management required discussion with the following services or consulting groups:  ENT/Plastic Surgery and Ophthalmology  Complexity of Problems Addressed Acute illness or injury that poses threat of life of bodily function  Additional Data Reviewed and Analyzed Further history obtained from: Further history from spouse/family member  Additional Factors Impacting ED Encounter Risk Consideration of hospitalization  Taylor Day. [place], MD [facility] Emergency Medicine [facility] [email or web address]  Final Clinical Impressions(s) / ED Diagnoses     ICD-10-CM   1. Left eyelid laceration, initial encounter  S01.112A       ED Discharge Orders          Ordered    bacitracin  ointment  2 times daily        09/05/24 0320             Discharge Instructions Discussed with and Provided to Patient:    Discharge Instructions      You were evaluated in the Emergency Department and after careful evaluation, we did not find any emergent condition requiring admission or further testing in the hospital.  Your exam/testing today is overall reassuring.  Dr. Austin the ophthalmologist repaired your laceration here in the emergency department.  You will need to follow-up for reassessment and suture removal in about a week.  Call the office number to schedule a formal appointment.  Recommend use of the bacitracin  twice daily to the area to help prevent infection.  Please return to the Emergency Department if you experience any worsening of your condition.   Thank you for allowing us  to be a part of your care.      Taylor Day Taylor HERO, MD 09/05/24 [phone]  "

## 2024-09-05 NOTE — ED Triage Notes (Signed)
 Pt BIB [facility] d/t fall - hit left eye on the corner of a table.  Has large hematoma and conjunctiva is hanging per EMS.  Pt has ETOH on board - not on thinners no LOC.    BP 146/68 HR 80 98% RA RR 18 CBG 176

## 2024-09-21 ENCOUNTER — Encounter: Payer: Self-pay | Admitting: Internal Medicine

## 2024-09-21 ENCOUNTER — Ambulatory Visit: Payer: Self-pay | Admitting: Internal Medicine

## 2024-09-21 VITALS — BP 120/70 | HR 62 | Resp 12 | Ht 60.0 in | Wt 137.0 lb

## 2024-09-21 DIAGNOSIS — Z5941 Food insecurity: Secondary | ICD-10-CM

## 2024-09-21 DIAGNOSIS — Z23 Encounter for immunization: Secondary | ICD-10-CM

## 2024-09-21 DIAGNOSIS — Z1231 Encounter for screening mammogram for malignant neoplasm of breast: Secondary | ICD-10-CM

## 2024-09-21 DIAGNOSIS — M2560 Stiffness of unspecified joint, not elsewhere classified: Secondary | ICD-10-CM

## 2024-09-21 DIAGNOSIS — Z59869 Financial insecurity, unspecified: Secondary | ICD-10-CM

## 2024-09-21 DIAGNOSIS — Z1211 Encounter for screening for malignant neoplasm of colon: Secondary | ICD-10-CM

## 2024-09-21 NOTE — Progress Notes (Unsigned)
" ° ° °  Subjective:    Patient ID: Taylor Day, female   DOB: 1955-09-12, 69 y.o.   MRN: [id number]   HPI  Here to establish   Bilateral shoulder and arm pain and stiffness, the latter being the predominant symptom.  Has had this for several months. Discomfort is intermittent.  Worse after at rest.    2.  Low back pain and maybe some stiffness for past year.  Ibuprofen 400 mg helps--either to get back to sleep or when arises in the morning.   No numbness, tingling or weakness in her buttocks or legs. No loss of bowel or bladder function.   [place] down a manhole that was not covered properly in 2019 and bruised up her body.  She is not clear if this is the same pain as she developed after the fall.  Pain is worst when trying to sleep and when arises in the morning.  3.  HM:  Has not had Tetanus vaccine for 10 years.  Cannot recall last mammogram (never) or pap.  The latter was in her 30s and normal.  Never had colonoscopy.  4.  Congested for a couple of days.  No fever or dyspnea  Current Meds  Medication Sig   aspirin EC 81 MG tablet Take 81 mg by mouth daily.   bacitracin  ointment Apply 1 Application topically 2 (two) times daily.   ibuprofen (ADVIL,MOTRIN) 200 MG tablet Take 200 mg by mouth every 6 (six) hours as needed for mild pain.   No Known Allergies   Review of Systems    Objective:   BP 120/70 (BP Location: Left Arm, Patient Position: Sitting, Cuff Size: Normal)   Pulse 62   Resp 12   Ht 5' (1.524 m)   Wt 137 lb (62.1 kg)   BMI 26.76 kg/m   Physical Exam   Assessment & Plan  "

## 2024-10-01 ENCOUNTER — Other Ambulatory Visit

## 2024-10-12 ENCOUNTER — Ambulatory Visit

## 2024-11-04 ENCOUNTER — Ambulatory Visit: Admitting: Internal Medicine

## 2025-03-30 ENCOUNTER — Encounter: Admitting: Internal Medicine
